# Patient Record
Sex: Male | Born: 1985 | Race: Black or African American | Hispanic: No | Marital: Single | Smoking: Former smoker
Health system: Southern US, Community
[De-identification: ages and names within clinical notes are randomized; demographics above are authoritative.]

---

## 2019-09-10 ENCOUNTER — Emergency Department (HOSPITAL_COMMUNITY): Payer: Self-pay

## 2019-09-10 ENCOUNTER — Inpatient Hospital Stay (HOSPITAL_COMMUNITY)
Admission: EM | Admit: 2019-09-10 | Discharge: 2019-09-13 | DRG: 958 | Payer: Self-pay | Attending: General Surgery | Admitting: General Surgery

## 2019-09-10 ENCOUNTER — Encounter (HOSPITAL_COMMUNITY): Payer: Self-pay | Admitting: Emergency Medicine

## 2019-09-10 ENCOUNTER — Other Ambulatory Visit: Payer: Self-pay

## 2019-09-10 DIAGNOSIS — J939 Pneumothorax, unspecified: Secondary | ICD-10-CM

## 2019-09-10 DIAGNOSIS — S45001A Unspecified injury of axillary artery, right side, initial encounter: Secondary | ICD-10-CM

## 2019-09-10 DIAGNOSIS — L089 Local infection of the skin and subcutaneous tissue, unspecified: Secondary | ICD-10-CM | POA: Diagnosis not present

## 2019-09-10 DIAGNOSIS — W3400XA Accidental discharge from unspecified firearms or gun, initial encounter: Secondary | ICD-10-CM

## 2019-09-10 DIAGNOSIS — S21341A Puncture wound with foreign body of right front wall of thorax with penetration into thoracic cavity, initial encounter: Secondary | ICD-10-CM | POA: Diagnosis present

## 2019-09-10 DIAGNOSIS — S51841A Puncture wound with foreign body of right forearm, initial encounter: Secondary | ICD-10-CM | POA: Diagnosis present

## 2019-09-10 DIAGNOSIS — Y249XXA Unspecified firearm discharge, undetermined intent, initial encounter: Secondary | ICD-10-CM

## 2019-09-10 DIAGNOSIS — S270XXA Traumatic pneumothorax, initial encounter: Secondary | ICD-10-CM

## 2019-09-10 DIAGNOSIS — S45091A Other specified injury of axillary artery, right side, initial encounter: Principal | ICD-10-CM | POA: Diagnosis present

## 2019-09-10 DIAGNOSIS — S62316B Displaced fracture of base of fifth metacarpal bone, right hand, initial encounter for open fracture: Secondary | ICD-10-CM

## 2019-09-10 DIAGNOSIS — S2241XA Multiple fractures of ribs, right side, initial encounter for closed fracture: Secondary | ICD-10-CM | POA: Diagnosis present

## 2019-09-10 DIAGNOSIS — Z23 Encounter for immunization: Secondary | ICD-10-CM

## 2019-09-10 DIAGNOSIS — J9690 Respiratory failure, unspecified, unspecified whether with hypoxia or hypercapnia: Secondary | ICD-10-CM

## 2019-09-10 DIAGNOSIS — S41131A Puncture wound without foreign body of right upper arm, initial encounter: Secondary | ICD-10-CM | POA: Diagnosis present

## 2019-09-10 DIAGNOSIS — Z20828 Contact with and (suspected) exposure to other viral communicable diseases: Secondary | ICD-10-CM | POA: Diagnosis present

## 2019-09-10 DIAGNOSIS — Y92410 Unspecified street and highway as the place of occurrence of the external cause: Secondary | ICD-10-CM

## 2019-09-10 DIAGNOSIS — S272XXA Traumatic hemopneumothorax, initial encounter: Secondary | ICD-10-CM | POA: Diagnosis present

## 2019-09-10 DIAGNOSIS — F17210 Nicotine dependence, cigarettes, uncomplicated: Secondary | ICD-10-CM | POA: Diagnosis present

## 2019-09-10 DIAGNOSIS — J942 Hemothorax: Secondary | ICD-10-CM

## 2019-09-10 LAB — CBC WITH DIFFERENTIAL/PLATELET
Abs Immature Granulocytes: 0.03 10*3/uL (ref 0.00–0.07)
Basophils Absolute: 0 10*3/uL (ref 0.0–0.1)
Basophils Relative: 0 %
Eosinophils Absolute: 0.2 10*3/uL (ref 0.0–0.5)
Eosinophils Relative: 2 %
HCT: 41.2 % (ref 39.0–52.0)
Hemoglobin: 13.4 g/dL (ref 13.0–17.0)
Immature Granulocytes: 0 %
Lymphocytes Relative: 26 %
Lymphs Abs: 3 10*3/uL (ref 0.7–4.0)
MCH: 29.2 pg (ref 26.0–34.0)
MCHC: 32.5 g/dL (ref 30.0–36.0)
MCV: 89.8 fL (ref 80.0–100.0)
Monocytes Absolute: 0.6 10*3/uL (ref 0.1–1.0)
Monocytes Relative: 5 %
Neutro Abs: 7.5 10*3/uL (ref 1.7–7.7)
Neutrophils Relative %: 67 %
Platelets: 383 10*3/uL (ref 150–400)
RBC: 4.59 MIL/uL (ref 4.22–5.81)
RDW: 13.8 % (ref 11.5–15.5)
WBC: 11.3 10*3/uL — ABNORMAL HIGH (ref 4.0–10.5)
nRBC: 0 % (ref 0.0–0.2)

## 2019-09-10 LAB — I-STAT CHEM 8, ED
BUN: 11 mg/dL (ref 6–20)
Calcium, Ion: 1.04 mmol/L — ABNORMAL LOW (ref 1.15–1.40)
Chloride: 102 mmol/L (ref 98–111)
Creatinine, Ser: 1.2 mg/dL (ref 0.61–1.24)
Glucose, Bld: 222 mg/dL — ABNORMAL HIGH (ref 70–99)
HCT: 45 % (ref 39.0–52.0)
Hemoglobin: 15.3 g/dL (ref 13.0–17.0)
Potassium: 3.4 mmol/L — ABNORMAL LOW (ref 3.5–5.1)
Sodium: 138 mmol/L (ref 135–145)
TCO2: 21 mmol/L — ABNORMAL LOW (ref 22–32)

## 2019-09-10 LAB — POC SARS CORONAVIRUS 2 AG -  ED: SARS Coronavirus 2 Ag: NEGATIVE

## 2019-09-10 LAB — SAMPLE TO BLOOD BANK

## 2019-09-10 MED ORDER — ONDANSETRON HCL 4 MG/2ML IJ SOLN
4.0000 mg | Freq: Once | INTRAMUSCULAR | Status: AC
  Administered 2019-09-10: 4 mg via INTRAVENOUS
  Filled 2019-09-10: qty 2

## 2019-09-10 MED ORDER — SODIUM CHLORIDE 0.9 % IV BOLUS
1000.0000 mL | Freq: Once | INTRAVENOUS | Status: AC
  Administered 2019-09-10: 1000 mL via INTRAVENOUS

## 2019-09-10 MED ORDER — TETANUS-DIPHTH-ACELL PERTUSSIS 5-2.5-18.5 LF-MCG/0.5 IM SUSP
0.5000 mL | Freq: Once | INTRAMUSCULAR | Status: AC
  Administered 2019-09-10: 0.5 mL via INTRAMUSCULAR
  Filled 2019-09-10: qty 0.5

## 2019-09-10 MED ORDER — HYDROMORPHONE HCL 1 MG/ML IJ SOLN
1.0000 mg | Freq: Once | INTRAMUSCULAR | Status: AC
  Administered 2019-09-10: 1 mg via INTRAVENOUS
  Filled 2019-09-10: qty 1

## 2019-09-10 MED ORDER — IOHEXOL 350 MG/ML SOLN
100.0000 mL | Freq: Once | INTRAVENOUS | Status: AC | PRN
  Administered 2019-09-10: 100 mL via INTRAVENOUS

## 2019-09-10 MED ORDER — SODIUM CHLORIDE 0.9 % IV BOLUS
500.0000 mL | Freq: Once | INTRAVENOUS | Status: AC
  Administered 2019-09-10: 500 mL via INTRAVENOUS

## 2019-09-10 MED ORDER — CEFAZOLIN SODIUM-DEXTROSE 2-4 GM/100ML-% IV SOLN
2.0000 g | Freq: Once | INTRAVENOUS | Status: AC
  Administered 2019-09-10: 2 g via INTRAVENOUS
  Filled 2019-09-10: qty 100
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
## 2019-09-10 NOTE — ED Notes (Signed)
Ortho tech called for splint placement. 
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
## 2019-09-10 NOTE — ED Notes (Signed)
Pt states he has pain with breathing. PA at bedside aware.
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
## 2019-09-11 ENCOUNTER — Inpatient Hospital Stay (HOSPITAL_COMMUNITY): Payer: Self-pay | Admitting: Anesthesiology

## 2019-09-11 ENCOUNTER — Encounter (HOSPITAL_COMMUNITY): Admission: EM | Payer: Self-pay | Source: Home / Self Care

## 2019-09-11 ENCOUNTER — Inpatient Hospital Stay (HOSPITAL_COMMUNITY): Payer: Self-pay | Admitting: Certified Registered"

## 2019-09-11 ENCOUNTER — Inpatient Hospital Stay (HOSPITAL_COMMUNITY): Payer: Self-pay

## 2019-09-11 ENCOUNTER — Encounter (HOSPITAL_COMMUNITY): Payer: Self-pay | Admitting: Anesthesiology

## 2019-09-11 DIAGNOSIS — W3400XA Accidental discharge from unspecified firearms or gun, initial encounter: Secondary | ICD-10-CM

## 2019-09-11 DIAGNOSIS — S45091A Other specified injury of axillary artery, right side, initial encounter: Secondary | ICD-10-CM

## 2019-09-11 HISTORY — PX: PERCUTANEOUS PINNING: SHX2209

## 2019-09-11 HISTORY — PX: I & D EXTREMITY: SHX5045

## 2019-09-11 HISTORY — PX: INTRAOPERATIVE ARTERIOGRAM: SHX5157

## 2019-09-11 HISTORY — PX: EMBOLECTOMY: SHX44

## 2019-09-11 HISTORY — PX: INSERTION OF ILIAC STENT: SHX6256

## 2019-09-11 HISTORY — PX: FOREIGN BODY REMOVAL: SHX962

## 2019-09-11 LAB — BASIC METABOLIC PANEL
Anion gap: 14 (ref 5–15)
Anion gap: 7 (ref 5–15)
BUN: 10 mg/dL (ref 6–20)
BUN: 6 mg/dL (ref 6–20)
CO2: 22 mmol/L (ref 22–32)
CO2: 26 mmol/L (ref 22–32)
Calcium: 9.1 mg/dL (ref 8.9–10.3)
Calcium: 8.1 mg/dL — ABNORMAL LOW (ref 8.9–10.3)
Chloride: 102 mmol/L (ref 98–111)
Chloride: 106 mmol/L (ref 98–111)
Creatinine, Ser: 1.38 mg/dL — ABNORMAL HIGH (ref 0.61–1.24)
Creatinine, Ser: 1.02 mg/dL (ref 0.61–1.24)
GFR calc Af Amer: >60 mL/min (ref >60)
GFR calc Af Amer: >60 mL/min (ref >60)
GFR calc non Af Amer: >60 mL/min (ref >60)
GFR calc non Af Amer: >60 mL/min (ref >60)
Glucose, Bld: 231 mg/dL — ABNORMAL HIGH (ref 70–99)
Glucose, Bld: 122 mg/dL — ABNORMAL HIGH (ref 70–99)
Potassium: 3.5 mmol/L (ref 3.5–5.1)
Potassium: 4 mmol/L (ref 3.5–5.1)
Sodium: 138 mmol/L (ref 135–145)
Sodium: 139 mmol/L (ref 135–145)

## 2019-09-11 LAB — CBC
HCT: 28.8 % — ABNORMAL LOW (ref 39.0–52.0)
Hemoglobin: 9.6 g/dL — ABNORMAL LOW (ref 13.0–17.0)
MCH: 29.7 pg (ref 26.0–34.0)
MCHC: 33.3 g/dL (ref 30.0–36.0)
MCV: 89.2 fL (ref 80.0–100.0)
Platelets: 274 10*3/uL (ref 150–400)
RBC: 3.23 MIL/uL — ABNORMAL LOW (ref 4.22–5.81)
RDW: 13.9 % (ref 11.5–15.5)
WBC: 12.5 10*3/uL — ABNORMAL HIGH (ref 4.0–10.5)
nRBC: 0 % (ref 0.0–0.2)

## 2019-09-11 LAB — RAPID URINE DRUG SCREEN, HOSP PERFORMED
Amphetamines: NOT DETECTED
Barbiturates: NOT DETECTED
Benzodiazepines: POSITIVE — AB
Cocaine: NOT DETECTED
Opiates: NOT DETECTED
Tetrahydrocannabinol: NOT DETECTED

## 2019-09-11 LAB — ETHANOL: Alcohol, Ethyl (B): <10 mg/dL (ref <10)

## 2019-09-11 LAB — HIV ANTIBODY (ROUTINE TESTING W REFLEX): HIV Screen 4th Generation wRfx: NONREACTIVE

## 2019-09-11 LAB — LACTIC ACID, PLASMA: Lactic Acid, Venous: 5.3 mmol/L (ref 0.5–1.9)

## 2019-09-11 LAB — MRSA PCR SCREENING: MRSA by PCR: NEGATIVE

## 2019-09-11 SURGERY — IRRIGATION AND DEBRIDEMENT EXTREMITY
Anesthesia: General | Site: Hand | Laterality: Right

## 2019-09-11 SURGERY — EMBOLECTOMY
Anesthesia: General | Laterality: Right

## 2019-09-11 MED ORDER — PHENYLEPHRINE 40 MCG/ML (10ML) SYRINGE FOR IV PUSH (FOR BLOOD PRESSURE SUPPORT)
PREFILLED_SYRINGE | INTRAVENOUS | Status: AC
  Filled 2019-09-11: qty 10

## 2019-09-11 MED ORDER — PHENYLEPHRINE 40 MCG/ML (10ML) SYRINGE FOR IV PUSH (FOR BLOOD PRESSURE SUPPORT)
PREFILLED_SYRINGE | INTRAVENOUS | Status: DC | PRN
  Administered 2019-09-11 (×12): 80 ug via INTRAVENOUS

## 2019-09-11 MED ORDER — ONDANSETRON HCL 4 MG/2ML IJ SOLN
INTRAMUSCULAR | Status: DC | PRN
  Administered 2019-09-11: 4 mg via INTRAVENOUS

## 2019-09-11 MED ORDER — 0.9 % SODIUM CHLORIDE (POUR BTL) OPTIME
TOPICAL | Status: DC | PRN
  Administered 2019-09-11: 1000 mL

## 2019-09-11 MED ORDER — ACETAMINOPHEN 500 MG PO TABS: 1000.0000 mg | ORAL_TABLET | Freq: Once | ORAL | Status: DC | PRN

## 2019-09-11 MED ORDER — FENTANYL CITRATE (PF) 250 MCG/5ML IJ SOLN
INTRAMUSCULAR | Status: DC | PRN
  Administered 2019-09-11: 100 ug via INTRAVENOUS
  Administered 2019-09-11 (×3): 50 ug via INTRAVENOUS

## 2019-09-11 MED ORDER — GABAPENTIN 100 MG PO CAPS
200.0000 mg | ORAL_CAPSULE | Freq: Three times a day (TID) | ORAL | Status: DC
  Administered 2019-09-11 – 2019-09-13 (×6): 200 mg via ORAL
  Filled 2019-09-11 (×6): qty 2

## 2019-09-11 MED ORDER — PROPOFOL 10 MG/ML IV BOLUS
INTRAVENOUS | Status: AC
  Filled 2019-09-11: qty 20

## 2019-09-11 MED ORDER — MIDAZOLAM HCL 5 MG/5ML IJ SOLN
INTRAMUSCULAR | Status: DC | PRN
  Administered 2019-09-11: 2 mg via INTRAVENOUS

## 2019-09-11 MED ORDER — HEPARIN SODIUM (PORCINE) 1000 UNIT/ML IJ SOLN
INTRAMUSCULAR | Status: DC | PRN
  Administered 2019-09-11: 6000 [IU] via INTRAVENOUS

## 2019-09-11 MED ORDER — ACETAMINOPHEN 10 MG/ML IV SOLN
INTRAVENOUS | Status: AC
  Filled 2019-09-11: qty 100

## 2019-09-11 MED ORDER — CHLORHEXIDINE GLUCONATE 4 % EX LIQD
60.0000 mL | Freq: Once | CUTANEOUS | Status: AC
  Administered 2019-09-11: 4 via TOPICAL
  Filled 2019-09-11: qty 60

## 2019-09-11 MED ORDER — BUPIVACAINE HCL (PF) 0.25 % IJ SOLN
INTRAMUSCULAR | Status: AC
  Filled 2019-09-11: qty 30

## 2019-09-11 MED ORDER — SUCCINYLCHOLINE CHLORIDE 200 MG/10ML IV SOSY
PREFILLED_SYRINGE | INTRAVENOUS | Status: AC
  Filled 2019-09-11: qty 10

## 2019-09-11 MED ORDER — FENTANYL CITRATE (PF) 250 MCG/5ML IJ SOLN
INTRAMUSCULAR | Status: AC
  Filled 2019-09-11: qty 5

## 2019-09-11 MED ORDER — FENTANYL CITRATE (PF) 100 MCG/2ML IJ SOLN
25.0000 ug | INTRAMUSCULAR | Status: DC | PRN
  Administered 2019-09-11 (×2): 50 ug via INTRAVENOUS

## 2019-09-11 MED ORDER — CEFAZOLIN SODIUM-DEXTROSE 2-3 GM-%(50ML) IV SOLR
INTRAVENOUS | Status: DC | PRN
  Administered 2019-09-11: 2 g via INTRAVENOUS

## 2019-09-11 MED ORDER — HYDROMORPHONE HCL 1 MG/ML IJ SOLN
1.0000 mg | INTRAMUSCULAR | Status: DC | PRN
  Administered 2019-09-11: 1 mg via INTRAVENOUS
  Filled 2019-09-11: qty 1

## 2019-09-11 MED ORDER — ONDANSETRON HCL 4 MG/2ML IJ SOLN
INTRAMUSCULAR | Status: AC
  Filled 2019-09-11: qty 2

## 2019-09-11 MED ORDER — HYDROMORPHONE HCL 1 MG/ML IJ SOLN
INTRAMUSCULAR | Status: AC
  Filled 2019-09-11: qty 1

## 2019-09-11 MED ORDER — OXYCODONE HCL 5 MG PO TABS: 5.0000 mg | ORAL_TABLET | Freq: Once | ORAL | Status: DC | PRN

## 2019-09-11 MED ORDER — PROTAMINE SULFATE 10 MG/ML IV SOLN
INTRAVENOUS | Status: AC
  Filled 2019-09-11: qty 5

## 2019-09-11 MED ORDER — DOCUSATE SODIUM 100 MG PO CAPS
100.0000 mg | ORAL_CAPSULE | Freq: Two times a day (BID) | ORAL | Status: DC
  Administered 2019-09-11 – 2019-09-13 (×4): 100 mg via ORAL
  Filled 2019-09-11 (×4): qty 1

## 2019-09-11 MED ORDER — FENTANYL CITRATE (PF) 250 MCG/5ML IJ SOLN
INTRAMUSCULAR | Status: DC | PRN
  Administered 2019-09-11: 50 ug via INTRAVENOUS

## 2019-09-11 MED ORDER — MIDAZOLAM HCL 5 MG/5ML IJ SOLN
INTRAMUSCULAR | Status: DC | PRN
  Administered 2019-09-11 (×2): 1 mg via INTRAVENOUS

## 2019-09-11 MED ORDER — ENOXAPARIN SODIUM 40 MG/0.4ML ~~LOC~~ SOLN
40.0000 mg | Freq: Every day | SUBCUTANEOUS | Status: DC
  Administered 2019-09-12 – 2019-09-13 (×2): 40 mg via SUBCUTANEOUS
  Filled 2019-09-11 (×3): qty 0.4

## 2019-09-11 MED ORDER — CEFAZOLIN SODIUM-DEXTROSE 2-4 GM/100ML-% IV SOLN
2.0000 g | Freq: Four times a day (QID) | INTRAVENOUS | Status: AC
  Administered 2019-09-11 – 2019-09-12 (×3): 2 g via INTRAVENOUS
  Filled 2019-09-11 (×5): qty 100

## 2019-09-11 MED ORDER — PROTAMINE SULFATE 10 MG/ML IV SOLN
INTRAVENOUS | Status: DC | PRN
  Administered 2019-09-11: 25 mg via INTRAVENOUS

## 2019-09-11 MED ORDER — MIDAZOLAM HCL 2 MG/2ML IJ SOLN
INTRAMUSCULAR | Status: AC
  Filled 2019-09-11: qty 2

## 2019-09-11 MED ORDER — ALBUMIN HUMAN 5 % IV SOLN
INTRAVENOUS | Status: DC | PRN
  Administered 2019-09-11 (×2): via INTRAVENOUS

## 2019-09-11 MED ORDER — ASPIRIN EC 81 MG PO TBEC
81.0000 mg | DELAYED_RELEASE_TABLET | Freq: Every day | ORAL | Status: DC
  Administered 2019-09-12 – 2019-09-13 (×2): 81 mg via ORAL
  Filled 2019-09-11 (×4): qty 1

## 2019-09-11 MED ORDER — ARTIFICIAL TEARS OPHTHALMIC OINT
TOPICAL_OINTMENT | OPHTHALMIC | Status: AC
  Filled 2019-09-11: qty 3.5

## 2019-09-11 MED ORDER — PROPOFOL 10 MG/ML IV BOLUS
INTRAVENOUS | Status: DC | PRN
  Administered 2019-09-11: 140 mg via INTRAVENOUS

## 2019-09-11 MED ORDER — CEFAZOLIN SODIUM 1 G IJ SOLR
INTRAMUSCULAR | Status: AC
  Filled 2019-09-11: qty 20

## 2019-09-11 MED ORDER — VANCOMYCIN HCL 1000 MG IV SOLR
INTRAVENOUS | Status: AC
  Filled 2019-09-11: qty 1000

## 2019-09-11 MED ORDER — OXYCODONE HCL 5 MG PO TABS
5.0000 mg | ORAL_TABLET | ORAL | Status: DC | PRN
  Administered 2019-09-11 (×3): 10 mg via ORAL
  Administered 2019-09-11: 5 mg via ORAL
  Administered 2019-09-12 – 2019-09-13 (×7): 10 mg via ORAL
  Filled 2019-09-11: qty 1
  Filled 2019-09-11 (×10): qty 2

## 2019-09-11 MED ORDER — ACETAMINOPHEN 160 MG/5ML PO SOLN: 1000.0000 mg | Freq: Once | ORAL | Status: DC | PRN

## 2019-09-11 MED ORDER — ACETAMINOPHEN 500 MG PO TABS
1000.0000 mg | ORAL_TABLET | Freq: Four times a day (QID) | ORAL | Status: DC
  Administered 2019-09-11 – 2019-09-13 (×8): 1000 mg via ORAL
  Filled 2019-09-11 (×8): qty 2

## 2019-09-11 MED ORDER — DEXAMETHASONE SODIUM PHOSPHATE 10 MG/ML IJ SOLN
INTRAMUSCULAR | Status: AC
  Filled 2019-09-11: qty 1

## 2019-09-11 MED ORDER — EPHEDRINE 5 MG/ML INJ
INTRAVENOUS | Status: AC
  Filled 2019-09-11: qty 10

## 2019-09-11 MED ORDER — ALBUMIN HUMAN 5 % IV SOLN
INTRAVENOUS | Status: DC | PRN
  Administered 2019-09-11: 16:00:00 via INTRAVENOUS

## 2019-09-11 MED ORDER — ONDANSETRON HCL 4 MG/2ML IJ SOLN: 4.0000 mg | Freq: Four times a day (QID) | INTRAMUSCULAR | Status: DC | PRN

## 2019-09-11 MED ORDER — SODIUM CHLORIDE 0.9 % IV SOLN
INTRAVENOUS | Status: DC | PRN
  Administered 2019-09-11: 500 mL

## 2019-09-11 MED ORDER — SODIUM CHLORIDE 0.9 % IV SOLN
INTRAVENOUS | Status: AC
  Filled 2019-09-11: qty 1.2

## 2019-09-11 MED ORDER — HEPARIN SODIUM (PORCINE) 1000 UNIT/ML IJ SOLN
INTRAMUSCULAR | Status: AC
  Filled 2019-09-11: qty 1

## 2019-09-11 MED ORDER — LIDOCAINE 2% (20 MG/ML) 5 ML SYRINGE
INTRAMUSCULAR | Status: AC
  Filled 2019-09-11: qty 5

## 2019-09-11 MED ORDER — HYDROMORPHONE HCL 1 MG/ML IJ SOLN
0.2500 mg | INTRAMUSCULAR | Status: DC | PRN
  Administered 2019-09-11 (×2): 0.5 mg via INTRAVENOUS

## 2019-09-11 MED ORDER — HYDROMORPHONE HCL 1 MG/ML IJ SOLN
0.2500 mg | INTRAMUSCULAR | Status: DC | PRN
  Administered 2019-09-11: 0.5 mg via INTRAVENOUS

## 2019-09-11 MED ORDER — HYDROMORPHONE HCL 1 MG/ML IJ SOLN
1.0000 mg | INTRAMUSCULAR | Status: DC | PRN
  Administered 2019-09-11 – 2019-09-12 (×2): 1 mg via INTRAVENOUS
  Filled 2019-09-11 (×2): qty 1

## 2019-09-11 MED ORDER — DEXTROSE-NACL 5-0.9 % IV SOLN
INTRAVENOUS | Status: DC
  Administered 2019-09-11 – 2019-09-12 (×3): via INTRAVENOUS

## 2019-09-11 MED ORDER — ROCURONIUM BROMIDE 10 MG/ML (PF) SYRINGE
PREFILLED_SYRINGE | INTRAVENOUS | Status: AC
  Filled 2019-09-11: qty 10

## 2019-09-11 MED ORDER — LIDOCAINE 2% (20 MG/ML) 5 ML SYRINGE
INTRAMUSCULAR | Status: DC | PRN
  Administered 2019-09-11: 60 mg via INTRAVENOUS

## 2019-09-11 MED ORDER — OXYCODONE HCL 5 MG/5ML PO SOLN: 5.0000 mg | Freq: Once | ORAL | Status: DC | PRN

## 2019-09-11 MED ORDER — ONDANSETRON 4 MG PO TBDP: 4.0000 mg | ORAL_TABLET | Freq: Four times a day (QID) | ORAL | Status: DC | PRN

## 2019-09-11 MED ORDER — SUCCINYLCHOLINE CHLORIDE 200 MG/10ML IV SOSY
PREFILLED_SYRINGE | INTRAVENOUS | Status: DC | PRN
  Administered 2019-09-11: 100 mg via INTRAVENOUS

## 2019-09-11 MED ORDER — CEFAZOLIN SODIUM-DEXTROSE 2-4 GM/100ML-% IV SOLN
2.0000 g | INTRAVENOUS | Status: AC
  Administered 2019-09-11: 2 g via INTRAVENOUS
  Filled 2019-09-11 (×2): qty 100

## 2019-09-11 MED ORDER — ACETAMINOPHEN 10 MG/ML IV SOLN
1000.0000 mg | Freq: Once | INTRAVENOUS | Status: DC | PRN
  Administered 2019-09-11: 1000 mg via INTRAVENOUS

## 2019-09-11 MED ORDER — ROCURONIUM BROMIDE 100 MG/10ML IV SOLN
INTRAVENOUS | Status: DC | PRN
  Administered 2019-09-11: 50 mg via INTRAVENOUS

## 2019-09-11 MED ORDER — FENTANYL CITRATE (PF) 100 MCG/2ML IJ SOLN
INTRAMUSCULAR | Status: AC
  Filled 2019-09-11: qty 2

## 2019-09-11 MED ORDER — DEXAMETHASONE SODIUM PHOSPHATE 10 MG/ML IJ SOLN
INTRAMUSCULAR | Status: DC | PRN
  Administered 2019-09-11: 10 mg via INTRAVENOUS

## 2019-09-11 MED ORDER — LIDOCAINE HCL (CARDIAC) PF 100 MG/5ML IV SOSY
PREFILLED_SYRINGE | INTRAVENOUS | Status: DC | PRN
  Administered 2019-09-11: 50 mg via INTRATRACHEAL

## 2019-09-11 MED ORDER — SODIUM CHLORIDE (PF) 0.9 % IJ SOLN
INTRAVENOUS | Status: DC | PRN
  Administered 2019-09-11: 100 mL via INTRAMUSCULAR

## 2019-09-11 MED ORDER — SODIUM CHLORIDE (PF) 0.9 % IJ SOLN
INTRAMUSCULAR | Status: AC
  Filled 2019-09-11: qty 10

## 2019-09-11 MED ORDER — SUGAMMADEX SODIUM 200 MG/2ML IV SOLN
INTRAVENOUS | Status: DC | PRN
  Administered 2019-09-11: 200 mg via INTRAVENOUS

## 2019-09-11 MED ORDER — LACTATED RINGERS IV SOLN
INTRAVENOUS | Status: DC
  Administered 2019-09-11: 15:00:00 via INTRAVENOUS

## 2019-09-11 MED ORDER — POVIDONE-IODINE 10 % EX SWAB: 2.0000 "application " | Freq: Once | CUTANEOUS | Status: DC

## 2019-09-11 MED ORDER — LACTATED RINGERS IV SOLN
INTRAVENOUS | Status: DC | PRN
  Administered 2019-09-11 (×2): via INTRAVENOUS

## 2019-09-11 MED ORDER — PROPOFOL 10 MG/ML IV BOLUS
INTRAVENOUS | Status: DC | PRN
  Administered 2019-09-11: 150 mg via INTRAVENOUS

## 2019-09-11 MED ORDER — BUPIVACAINE HCL 0.25 % IJ SOLN
INTRAMUSCULAR | Status: DC | PRN
  Administered 2019-09-11: 10 mL

## 2019-09-11 SURGICAL SUPPLY — 70 items
BALLN MUSTANG 5.0X40 75 (BALLOONS) ×2
BALLN MUSTANG 5.0X40 75CM (BALLOONS) ×1
BALLOON MUSTANG 5.0X40 75 (BALLOONS) IMPLANT
BNDG ELASTIC 4X5.8 VLCR STR LF (GAUZE/BANDAGES/DRESSINGS) ×2 IMPLANT
BNDG GAUZE ELAST 4 BULKY (GAUZE/BANDAGES/DRESSINGS) ×2 IMPLANT
CANISTER SUCT 3000ML PPV (MISCELLANEOUS) ×3 IMPLANT
CATH BEACON 5 .035 40 KMP TP (CATHETERS) IMPLANT
CATH BEACON 5 .038 40 KMP TP (CATHETERS) ×2
CATH EMB 4FR 80CM (CATHETERS) IMPLANT
CATH EMB 5FR 80CM (CATHETERS) IMPLANT
CATH POLY DUAL LUMEN OCCL 5FR (CATHETERS) ×2 IMPLANT
CLIP VESOCCLUDE MED 24/CT (CLIP) ×1 IMPLANT
CLIP VESOCCLUDE SM WIDE 24/CT (CLIP) ×1 IMPLANT
CLIP VESOCCLUDE SM WIDE 6/CT (CLIP) ×2 IMPLANT
COVER WAND RF STERILE (DRAPES) ×3 IMPLANT
DERMABOND ADVANCED (GAUZE/BANDAGES/DRESSINGS) ×2
DERMABOND ADVANCED .7 DNX12 (GAUZE/BANDAGES/DRESSINGS) ×1 IMPLANT
DRAIN CHANNEL 15F RND FF W/TCR (WOUND CARE) IMPLANT
DRAPE ORTHO SPLIT 77X108 STRL (DRAPES) ×4
DRAPE SURG ORHT 6 SPLT 77X108 (DRAPES) IMPLANT
DRAPE X-RAY CASS 24X20 (DRAPES) IMPLANT
ELECT REM PT RETURN 9FT ADLT (ELECTROSURGICAL) ×3
ELECTRODE REM PT RTRN 9FT ADLT (ELECTROSURGICAL) ×1 IMPLANT
EVACUATOR SILICONE 100CC (DRAIN) IMPLANT
GAUZE SPONGE 4X4 12PLY STRL LF (GAUZE/BANDAGES/DRESSINGS) ×2 IMPLANT
GLIDEWIRE ADV .035X260CM (WIRE) ×2 IMPLANT
GLOVE BIO SURGEON STRL SZ7.5 (GLOVE) ×3 IMPLANT
GLOVE BIOGEL PI IND STRL 6 (GLOVE) IMPLANT
GLOVE BIOGEL PI INDICATOR 6 (GLOVE) ×2
GLOVE SURG SS PI 6.5 STRL IVOR (GLOVE) ×6 IMPLANT
GOWN STRL REUS W/ TWL LRG LVL3 (GOWN DISPOSABLE) ×2 IMPLANT
GOWN STRL REUS W/ TWL XL LVL3 (GOWN DISPOSABLE) ×1 IMPLANT
GOWN STRL REUS W/TWL LRG LVL3 (GOWN DISPOSABLE) ×6
GOWN STRL REUS W/TWL XL LVL3 (GOWN DISPOSABLE) ×2
HEMOSTAT SNOW SURGICEL 2X4 (HEMOSTASIS) IMPLANT
KIT BASIN OR (CUSTOM PROCEDURE TRAY) ×3 IMPLANT
KIT ENCORE 26 ADVANTAGE (KITS) ×2 IMPLANT
KIT TURNOVER KIT B (KITS) ×3 IMPLANT
NS IRRIG 1000ML POUR BTL (IV SOLUTION) ×6 IMPLANT
PACK PERIPHERAL VASCULAR (CUSTOM PROCEDURE TRAY) ×3 IMPLANT
PAD ABD 8X10 STRL (GAUZE/BANDAGES/DRESSINGS) ×2 IMPLANT
PAD ARMBOARD 7.5X6 YLW CONV (MISCELLANEOUS) ×6 IMPLANT
SET COLLECT BLD 21X3/4 12 (NEEDLE) IMPLANT
SET MICROPUNCTURE 5F STIFF (MISCELLANEOUS) ×2 IMPLANT
SHEATH PINNACLE 6F 10CM (SHEATH) ×2 IMPLANT
SHEATH PINNACLE 8F 10CM (SHEATH) ×2 IMPLANT
SPONGE LAP 18X18 X RAY DECT (DISPOSABLE) ×2 IMPLANT
STENT VIABAHN 6X50X120 (Permanent Stent) ×2 IMPLANT
STOPCOCK 4 WAY LG BORE MALE ST (IV SETS) IMPLANT
SUT ETHILON 3 0 PS 1 (SUTURE) IMPLANT
SUT MNCRL AB 4-0 PS2 18 (SUTURE) ×1 IMPLANT
SUT PROLENE 5 0 C 1 24 (SUTURE) IMPLANT
SUT PROLENE 6 0 BV (SUTURE) ×5 IMPLANT
SUT SILK 3 0 (SUTURE)
SUT SILK 3-0 18XBRD TIE 12 (SUTURE) IMPLANT
SUT VIC AB 2-0 CT1 27 (SUTURE)
SUT VIC AB 2-0 CT1 TAPERPNT 27 (SUTURE) ×1 IMPLANT
SUT VIC AB 3-0 SH 27 (SUTURE) ×2
SUT VIC AB 3-0 SH 27X BRD (SUTURE) ×1 IMPLANT
SUT VIC AB 4-0 PS2 18 (SUTURE) ×2 IMPLANT
SYR 10ML LL (SYRINGE) ×4 IMPLANT
SYR 3ML LL SCALE MARK (SYRINGE) IMPLANT
SYR BULB IRRIGATION 50ML (SYRINGE) ×2 IMPLANT
TOWEL GREEN STERILE (TOWEL DISPOSABLE) ×3 IMPLANT
TRAY FOLEY MTR SLVR 16FR STAT (SET/KITS/TRAYS/PACK) ×3 IMPLANT
TUBING EXTENTION W/L.L. (IV SETS) IMPLANT
UNDERPAD 30X30 (UNDERPADS AND DIAPERS) ×3 IMPLANT
WATER STERILE IRR 1000ML POUR (IV SOLUTION) ×3 IMPLANT
WIRE BENTSON .035X145CM (WIRE) ×2 IMPLANT
WIRE G V18X300CM (WIRE) ×2 IMPLANT

## 2019-09-11 SURGICAL SUPPLY — 71 items
BANDAGE ESMARK 6X9 LF (GAUZE/BANDAGES/DRESSINGS) IMPLANT
BLADE SURG 10 STRL SS (BLADE) ×3 IMPLANT
BNDG COHESIVE 4X5 TAN STRL (GAUZE/BANDAGES/DRESSINGS) ×3 IMPLANT
BNDG CONFORM 2 STRL LF (GAUZE/BANDAGES/DRESSINGS) ×2 IMPLANT
BNDG ELASTIC 2X5.8 VLCR STR LF (GAUZE/BANDAGES/DRESSINGS) ×2 IMPLANT
BNDG ELASTIC 3X5.8 VLCR STR LF (GAUZE/BANDAGES/DRESSINGS) ×2 IMPLANT
BNDG ELASTIC 4X5.8 VLCR STR LF (GAUZE/BANDAGES/DRESSINGS) ×3 IMPLANT
BNDG ELASTIC 6X5.8 VLCR STR LF (GAUZE/BANDAGES/DRESSINGS) ×3 IMPLANT
BNDG ESMARK 4X9 LF (GAUZE/BANDAGES/DRESSINGS) IMPLANT
BNDG ESMARK 6X9 LF (GAUZE/BANDAGES/DRESSINGS)
BNDG GAUZE ELAST 4 BULKY (GAUZE/BANDAGES/DRESSINGS) ×3 IMPLANT
CONT SPEC 4OZ CLIKSEAL STRL BL (MISCELLANEOUS) IMPLANT
CORD BIPOLAR FORCEPS 12FT (ELECTRODE) ×2 IMPLANT
COVER SURGICAL LIGHT HANDLE (MISCELLANEOUS) ×3 IMPLANT
COVER WAND RF STERILE (DRAPES) ×3 IMPLANT
CUFF TOURN SGL LL 12 NO SLV (MISCELLANEOUS) IMPLANT
CUFF TOURN SGL QUICK 34 (TOURNIQUET CUFF)
CUFF TRNQT CYL 34X4.125X (TOURNIQUET CUFF) IMPLANT
DRAPE SURG 17X23 STRL (DRAPES) IMPLANT
DRAPE U-SHAPE 47X51 STRL (DRAPES) IMPLANT
DRSG ADAPTIC 3X8 NADH LF (GAUZE/BANDAGES/DRESSINGS) ×2 IMPLANT
DRSG PAD ABDOMINAL 8X10 ST (GAUZE/BANDAGES/DRESSINGS) ×3 IMPLANT
DRSG XEROFORM 1X8 (GAUZE/BANDAGES/DRESSINGS) ×2 IMPLANT
DURAPREP 26ML APPLICATOR (WOUND CARE) ×3 IMPLANT
ELECT REM PT RETURN 9FT ADLT (ELECTROSURGICAL)
ELECTRODE REM PT RTRN 9FT ADLT (ELECTROSURGICAL) IMPLANT
EVACUATOR 1/8 PVC DRAIN (DRAIN) IMPLANT
FACESHIELD WRAPAROUND (MASK) IMPLANT
FACESHIELD WRAPAROUND OR TEAM (MASK) ×1 IMPLANT
GAUZE SPONGE 4X4 12PLY STRL (GAUZE/BANDAGES/DRESSINGS) ×3 IMPLANT
GAUZE XEROFORM 1X8 LF (GAUZE/BANDAGES/DRESSINGS) ×3 IMPLANT
GLOVE BIO SURGEON STRL SZ7.5 (GLOVE) ×6 IMPLANT
GLOVE BIOGEL PI IND STRL 8 (GLOVE) ×2 IMPLANT
GLOVE BIOGEL PI INDICATOR 8 (GLOVE) ×4
GOWN STRL REUS W/ TWL LRG LVL3 (GOWN DISPOSABLE) ×3 IMPLANT
GOWN STRL REUS W/TWL LRG LVL3 (GOWN DISPOSABLE) ×6
HANDPIECE INTERPULSE COAX TIP (DISPOSABLE)
K-WIRE .045X4 (WIRE) ×2 IMPLANT
K-WIRE .062 (WIRE) ×2
K-WIRE FX6X.062X2 END TROC (WIRE) ×1
KIT BASIN OR (CUSTOM PROCEDURE TRAY) ×3 IMPLANT
KIT TURNOVER KIT B (KITS) ×3 IMPLANT
KWIRE FX6X.062X2 END TROC (WIRE) IMPLANT
MANIFOLD NEPTUNE II (INSTRUMENTS) ×3 IMPLANT
NDL HYPO 25GX1X1/2 BEV (NEEDLE) IMPLANT
NEEDLE HYPO 25GX1X1/2 BEV (NEEDLE) ×3 IMPLANT
NS IRRIG 1000ML POUR BTL (IV SOLUTION) ×3 IMPLANT
PACK ORTHO EXTREMITY (CUSTOM PROCEDURE TRAY) ×3 IMPLANT
PAD ABD 8X10 STRL (GAUZE/BANDAGES/DRESSINGS) ×2 IMPLANT
PAD ARMBOARD 7.5X6 YLW CONV (MISCELLANEOUS) ×6 IMPLANT
PAD CAST 3X4 CTTN HI CHSV (CAST SUPPLIES) IMPLANT
PADDING CAST COTTON 3X4 STRL (CAST SUPPLIES) ×2
PADDING UNDERCAST 2 STRL (CAST SUPPLIES) ×2
PADDING UNDERCAST 2X4 STRL (CAST SUPPLIES) IMPLANT
SET CYSTO W/LG BORE CLAMP LF (SET/KITS/TRAYS/PACK) IMPLANT
SET HNDPC FAN SPRY TIP SCT (DISPOSABLE) IMPLANT
SLING ARM FOAM STRAP LRG (SOFTGOODS) ×2 IMPLANT
SPLINT PLASTER EXTRA FAST 3X15 (CAST SUPPLIES) ×6
SPLINT PLASTER GYPS XFAST 3X15 (CAST SUPPLIES) IMPLANT
SPONGE LAP 18X18 RF (DISPOSABLE) IMPLANT
STOCKINETTE IMPERVIOUS 9X36 MD (GAUZE/BANDAGES/DRESSINGS) ×3 IMPLANT
SUT ETHILON 3 0 PS 1 (SUTURE) ×4 IMPLANT
SUT PDS AB 2-0 CT1 27 (SUTURE) IMPLANT
SWAB CULTURE ESWAB REG 1ML (MISCELLANEOUS) IMPLANT
SYR CONTROL 10ML LL (SYRINGE) IMPLANT
TOWEL GREEN STERILE (TOWEL DISPOSABLE) ×3 IMPLANT
TOWEL GREEN STERILE FF (TOWEL DISPOSABLE) ×3 IMPLANT
TUBE CONNECTING 12'X1/4 (SUCTIONS) ×2
TUBE CONNECTING 12X1/4 (SUCTIONS) ×3 IMPLANT
UNDERPAD 30X30 (UNDERPADS AND DIAPERS) ×3 IMPLANT
YANKAUER SUCT BULB TIP NO VENT (SUCTIONS) ×3 IMPLANT
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
## 2019-09-11 NOTE — Transfer of Care (Signed)
Immediate Anesthesia Transfer of Care Note  Patient: Nicholas Burnett  Procedure(s) Performed: THROMBECTOMY RIGHT AXILLARY ARTERY (Right ) INSERTION OF RIGHT AXILLARY ARTERY STENT 6MMX 5CM (Right ) Intra Operative Arteriogram RIGHT UPPER EXTREMITY (Right )  Patient Location: PACU  Anesthesia Type:General  Level of Consciousness: awake  Airway & Oxygen Therapy: Patient Spontanous Breathing and Patient connected to face mask oxygen  Post-op Assessment: Report given to RN and Post -op Vital signs reviewed and stable  Post vital signs: Reviewed and stable  Last Vitals:  Vitals Value Taken Time  BP    Temp    Pulse 127 09/11/19 0429  Resp 25 09/11/19 0429  SpO2 100 % 09/11/19 0429  Vitals shown include unvalidated device data.  Last Pain:  Vitals:   09/10/19 2226  TempSrc: Tympanic         Complications: No apparent anesthesia complications
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
## 2019-09-11 NOTE — Progress Notes (Signed)
Orthopedic Tech Progress Note Patient Details:  Saw Mendenhall 26-Apr-1986 [id number]  Patient ID: Nicholas Burnett, male   DOB: [birth year], 33 y.o.   MRN: [id number]   [name] 09/11/2019, Tresa Moore informed me in report that the patient is going to surgery, and will not need splint at this time.
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
## 2019-09-11 NOTE — Progress Notes (Signed)
Pt received from PACU, AO x4, slightly drowsy. Complaning of pain to right arm, had received pain meds before transfer. Vitals stable, CHG bath completed. Connected to monitor and CCMD notified. Right arm surgical site has ace wrap, dry and intact. Chest tube to right lateral chest intact, dranning bloody output. CT to wall suction -20. Oriented pt to room and call bell system. Two [employer] outside pt's room, not allowed any phone calls or contact with family/friends at this time per [employer].will continue to monitor.

## 2019-09-11 NOTE — Progress Notes (Signed)
PT Cancellation Note  Patient Details Name: Nicholas Burnett MRN: [id number] DOB: [birth year]   Cancelled Treatment:    Reason Eval/Treat Not Completed: Other (comment). Pt going for surgery on hand today. Had vascular surgery earlier this morning. Acute PT to return as able, as appropriate to complete PT eval.  Kittie Plater, PT, DPT Acute Rehabilitation Services Pager #: [phone] Office #: [phone]    Berline Lopes 09/11/2019, 12:29 PM

## 2019-09-11 NOTE — Interval H&P Note (Signed)
I participated in the care of this patient and agree with the above history, physical and evaluation. I performed a review of the history and a physical exam as detailed   Dezi Brauner Daniel Alek Borges MD  
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
## 2019-09-11 NOTE — Progress Notes (Addendum)
[facility]  Subjective  - No new complaints   Objective 137/84 92 99.7 F (37.6 C) (Oral) 17 100%  Intake/Output Summary (Last 24 hours) at 09/11/2019 0707 Last data filed at 09/11/2019 0510 Gross per 24 hour  Intake 3050 ml  Output 2940 ml  Net 110 ml    Right radial palpable pulse, fingers warm and motor intact Dressing clean and dry  Assessment/Planning: 1.  Exposure right brachial artery 2.  Right upper extremity thromboembolectomy 3.  Right upper extremity angiogram 4.  Stent of right axillary artery with 26mm x 5cm Viabahn  Started 81 mg Aspirin this am will add Plavix when OK with Trauma service. Elevation encouraged  [name] 09/11/2019 7:07 AM --  Laboratory Lab Results: Recent Labs    09/10/19 2237 09/10/19 2240  WBC  --  11.3*  HGB 15.3 13.4  HCT 45.0 41.2  PLT  --  383   BMET Recent Labs    09/10/19 2237 09/10/19 2240  NA 138 138  K 3.4* 3.5  CL 102 102  CO2  --  22  GLUCOSE 222* 231*  BUN 11 10  CREATININE 1.20 1.38*  CALCIUM  --  9.1    COAG No results found for: INR, PROTIME No results found for: PTT   I have independently interviewed and examined patient and agree with PA assessment and plan above.  Aspirin for now.  Ideally would start Plavix prior to discharge.  Nicholas Burnett C. Donzetta Matters, MD [facility] Office: [phone] Pager: [phone]

## 2019-09-11 NOTE — Anesthesia Procedure Notes (Signed)
Procedure Name: LMA Insertion Date/Time: 09/11/2019 3:03 PM Performed by: Janene Harvey, CRNA Pre-anesthesia Checklist: Patient identified, Emergency Drugs available, Suction available and Patient being monitored Patient Re-evaluated:Patient Re-evaluated prior to induction Oxygen Delivery Method: Circle system utilized Preoxygenation: Pre-oxygenation with 100% oxygen Induction Type: IV induction LMA: LMA inserted LMA Size: 4.0 Dental Injury: Teeth and Oropharynx as per pre-operative assessment

## 2019-09-11 NOTE — Anesthesia Procedure Notes (Signed)
Procedure Name: Intubation Date/Time: 09/11/2019 2:48 AM Performed by: Clovis Cao, CRNA Pre-anesthesia Checklist: Patient identified, Emergency Drugs available, Patient being monitored, Suction available and Timeout performed Patient Re-evaluated:Patient Re-evaluated prior to induction Oxygen Delivery Method: Circle system utilized Preoxygenation: Pre-oxygenation with 100% oxygen Induction Type: Rapid sequence, IV induction and Cricoid Pressure applied Laryngoscope Size: Miller and 2 Grade View: Grade I Tube type: Oral Tube size: 8.0 mm Number of attempts: 1 Airway Equipment and Method: Stylet Placement Confirmation: ETT inserted through vocal cords under direct vision,  positive ETCO2 and breath sounds checked- equal and bilateral Secured at: 22 cm Tube secured with: Tape Dental Injury: Teeth and Oropharynx as per pre-operative assessment

## 2019-09-11 NOTE — Transfer of Care (Signed)
Immediate Anesthesia Transfer of Care Note  Patient: Claudie Leach  Procedure(s) Performed: IRRIGATION AND DEBRIDEMENT HAND (Right Hand) Percutaneous Pinning Extremity (Right Hand) Foreign Body Removal Adult (Right Hand)  Patient Location: PACU  Anesthesia Type:General  Level of Consciousness: drowsy  Airway & Oxygen Therapy: Patient Spontanous Breathing and Patient connected to face mask oxygen  Post-op Assessment: Report given to RN and Post -op Vital signs reviewed and stable  Post vital signs: Reviewed  Last Vitals:  Vitals Value Taken Time  BP 116/55 09/11/19 1605  Temp    Pulse 75 09/11/19 1609  Resp 17 09/11/19 1609  SpO2 96 % 09/11/19 1609  Vitals shown include unvalidated device data.  Last Pain:  Vitals:   09/11/19 1241  TempSrc: Oral  PainSc: 10-Worst pain ever      Patients Stated Pain Goal: 0 (13/[birth year] 7846)  Complications: No apparent anesthesia complications
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
## 2019-09-11 NOTE — Procedures (Signed)
Pt had R pigtail catheter placed via seldinger technique + air rush PT tol well Chest CT showed chest tube within chest.

## 2019-09-11 NOTE — ED Notes (Signed)
Dr. Cain at bedside.
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
## 2019-09-11 NOTE — Anesthesia Preprocedure Evaluation (Addendum)
Anesthesia Evaluation  Patient identified by MRN, date of birth, ID band Patient awake    Reviewed: Allergy & Precautions, H&P , NPO status , Patient's Chart, lab work & pertinent test results  Airway Mallampati: II  TM Distance: >3 FB Neck ROM: Full    Dental no notable dental hx. (+) Teeth Intact, Dental Advisory Given   Pulmonary Current Smoker and Patient abstained from smoking.,    Pulmonary exam normal breath sounds clear to auscultation       Cardiovascular negative cardio ROS   Rhythm:Regular Rate:Normal     Neuro/Psych negative neurological ROS  negative psych ROS   GI/Hepatic negative GI ROS, Neg liver ROS,   Endo/Other  negative endocrine ROS  Renal/GU negative Renal ROS  negative genitourinary   Musculoskeletal   Abdominal   Peds  Hematology negative hematology ROS (+)   Anesthesia Other Findings   Reproductive/Obstetrics negative OB ROS                            Anesthesia Physical Anesthesia Plan  ASA: I  Anesthesia Plan: General   Post-op Pain Management:    Induction: Intravenous  PONV Risk Score and Plan: 3 and Ondansetron, Dexamethasone and Midazolam  Airway Management Planned: LMA  Additional Equipment:   Intra-op Plan:   Post-operative Plan: Extubation in OR  Informed Consent: I have reviewed the patients History and Physical, chart, labs and discussed the procedure including the risks, benefits and alternatives for the proposed anesthesia with the patient or authorized representative who has indicated his/her understanding and acceptance.     Dental advisory given  Plan Discussed with: CRNA  Anesthesia Plan Comments:         Anesthesia Quick Evaluation

## 2019-09-12 ENCOUNTER — Encounter (HOSPITAL_COMMUNITY): Payer: Self-pay | Admitting: Vascular Surgery

## 2019-09-12 ENCOUNTER — Inpatient Hospital Stay (HOSPITAL_COMMUNITY): Payer: Self-pay

## 2019-09-12 MED ORDER — METHOCARBAMOL 500 MG PO TABS
1000.0000 mg | ORAL_TABLET | Freq: Three times a day (TID) | ORAL | Status: DC
  Administered 2019-09-12 – 2019-09-13 (×3): 1000 mg via ORAL
  Filled 2019-09-12 (×3): qty 2

## 2019-09-12 MED ORDER — MAGNESIUM CITRATE PO SOLN: 1.0000 | Freq: Once | ORAL | Status: DC

## 2019-09-12 MED ORDER — BISACODYL 10 MG RE SUPP: 10.0000 mg | Freq: Once | RECTAL | Status: DC

## 2019-09-12 MED ORDER — HYDROMORPHONE HCL 1 MG/ML IJ SOLN
0.5000 mg | Freq: Three times a day (TID) | INTRAMUSCULAR | Status: DC
  Administered 2019-09-12 – 2019-09-13 (×3): 0.5 mg via INTRAVENOUS
  Filled 2019-09-12 (×3): qty 1

## 2019-09-12 MED ORDER — KETOROLAC TROMETHAMINE 15 MG/ML IJ SOLN
15.0000 mg | Freq: Four times a day (QID) | INTRAMUSCULAR | Status: DC | PRN
  Administered 2019-09-12 – 2019-09-13 (×4): 15 mg via INTRAVENOUS
  Filled 2019-09-12 (×4): qty 1
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
## 2019-09-12 NOTE — Anesthesia Postprocedure Evaluation (Signed)
Anesthesia Post Note  Patient: Nicholas Burnett  Procedure(s) Performed: IRRIGATION AND DEBRIDEMENT HAND (Right Hand) Percutaneous Pinning Extremity (Right Hand) Foreign Body Removal Adult (Right Hand)     Patient location during evaluation: PACU Anesthesia Type: General Level of consciousness: sedated and patient cooperative Pain management: pain level controlled Vital Signs Assessment: post-procedure vital signs reviewed and stable Respiratory status: spontaneous breathing Cardiovascular status: stable Anesthetic complications: no    Last Vitals:  Vitals:   09/11/19 2158 09/12/19 0320  BP:  112/65  Pulse:  63  Resp:  13  Temp: 36.9 C 37.1 C  SpO2:  100%    Last Pain:  Vitals:   09/12/19 0548  TempSrc:   PainSc: [place]
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
## 2019-09-13 ENCOUNTER — Inpatient Hospital Stay (HOSPITAL_COMMUNITY): Payer: Self-pay

## 2019-09-13 LAB — CBC
HCT: 24.5 % — ABNORMAL LOW (ref 39.0–52.0)
HCT: 24.1 % — ABNORMAL LOW (ref 39.0–52.0)
Hemoglobin: 8.2 g/dL — ABNORMAL LOW (ref 13.0–17.0)
Hemoglobin: 7.9 g/dL — ABNORMAL LOW (ref 13.0–17.0)
MCH: 30.1 pg (ref 26.0–34.0)
MCH: 29.8 pg (ref 26.0–34.0)
MCHC: 33.5 g/dL (ref 30.0–36.0)
MCHC: 32.8 g/dL (ref 30.0–36.0)
MCV: 90.1 fL (ref 80.0–100.0)
MCV: 90.9 fL (ref 80.0–100.0)
Platelets: 268 10*3/uL (ref 150–400)
Platelets: 265 10*3/uL (ref 150–400)
RBC: 2.72 MIL/uL — ABNORMAL LOW (ref 4.22–5.81)
RBC: 2.65 MIL/uL — ABNORMAL LOW (ref 4.22–5.81)
RDW: 13.7 % (ref 11.5–15.5)
RDW: 14 % (ref 11.5–15.5)
WBC: 13.4 10*3/uL — ABNORMAL HIGH (ref 4.0–10.5)
WBC: 10.9 10*3/uL — ABNORMAL HIGH (ref 4.0–10.5)
nRBC: 0 % (ref 0.0–0.2)
nRBC: 0 % (ref 0.0–0.2)

## 2019-09-13 LAB — BASIC METABOLIC PANEL
Anion gap: 10 (ref 5–15)
BUN: 6 mg/dL (ref 6–20)
CO2: 28 mmol/L (ref 22–32)
Calcium: 8.9 mg/dL (ref 8.9–10.3)
Chloride: 102 mmol/L (ref 98–111)
Creatinine, Ser: 1.04 mg/dL (ref 0.61–1.24)
GFR calc Af Amer: >60 mL/min (ref >60)
GFR calc non Af Amer: >60 mL/min (ref >60)
Glucose, Bld: 103 mg/dL — ABNORMAL HIGH (ref 70–99)
Potassium: 3.4 mmol/L — ABNORMAL LOW (ref 3.5–5.1)
Sodium: 140 mmol/L (ref 135–145)

## 2019-09-13 LAB — PHOSPHORUS: Phosphorus: 2.8 mg/dL (ref 2.5–4.6)

## 2019-09-13 LAB — MAGNESIUM: Magnesium: 1.7 mg/dL (ref 1.7–2.4)

## 2019-09-13 MED ORDER — ASPIRIN 81 MG PO TBEC: 81.0000 mg | DELAYED_RELEASE_TABLET | Freq: Every day | ORAL | Status: DC

## 2019-09-13 MED ORDER — GABAPENTIN 100 MG PO CAPS: 200.0000 mg | ORAL_CAPSULE | Freq: Three times a day (TID) | ORAL | 0 refills | Status: DC

## 2019-09-13 MED ORDER — METHOCARBAMOL 750 MG PO TABS: 750.0000 mg | ORAL_TABLET | Freq: Three times a day (TID) | ORAL | Status: DC | PRN

## 2019-09-13 MED ORDER — CLOPIDOGREL BISULFATE 75 MG PO TABS: 75.0000 mg | ORAL_TABLET | Freq: Every day | ORAL | 0 refills | Status: AC

## 2019-09-13 MED ORDER — METHOCARBAMOL 750 MG PO TABS: 750.0000 mg | ORAL_TABLET | Freq: Three times a day (TID) | ORAL | 0 refills | Status: DC | PRN

## 2019-09-13 MED ORDER — OXYCODONE HCL 5 MG PO TABS: 5.0000 mg | ORAL_TABLET | ORAL | 0 refills | Status: DC | PRN

## 2019-09-13 MED ORDER — ACETAMINOPHEN 500 MG PO TABS: 1000.0000 mg | ORAL_TABLET | Freq: Four times a day (QID) | ORAL | 0 refills | Status: DC

## 2019-09-13 NOTE — Progress Notes (Signed)
Pt given discharge instructions. Packet given to [employer] to give to jail medical staff. Patient discharged to [employer] custody. Katherina Right RN

## 2019-09-13 NOTE — Progress Notes (Signed)
Removed patient chest tube. Tube intact. Patient tolerated well. No new drainage noted in collection canister. Will continue to monitor. Katherina Right RN
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
## 2019-09-13 NOTE — Discharge Instructions (Signed)
Pneumothorax °A pneumothorax is commonly called a collapsed lung. It is a condition in which air leaks from a lung and builds up between the thin layer of tissue that covers the lungs (visceral pleura) and the interior wall of the chest cavity (parietal pleura). The air gets trapped outside the lung, between the lung and the chest wall (pleural space). The air takes up space and prevents the lung from fully expanding. °This condition sometimes occurs suddenly with no apparent cause. The buildup of air may be small or large. A small pneumothorax may go away on its own. A large pneumothorax will require treatment and hospitalization. °What are the causes? °This condition may be caused by: °· Trauma and injury to the chest wall. °· Surgery and other medical procedures. °· A complication of an underlying lung problem, especially chronic obstructive pulmonary disease (COPD) or emphysema. °Sometimes the cause of this condition is not known. °What increases the risk? °You are more likely to develop this condition if: °· You have an underlying lung problem. °· You smoke. °· You are 20-40 years old, male, tall, and underweight. °· You have a personal or family history of pneumothorax. °· You have an eating disorder (anorexia nervosa). °This condition can also happen quickly, even in people with no history of lung problems. °What are the signs or symptoms? °Sometimes a pneumothorax will have no symptoms. When symptoms are present, they can include: °· Chest pain. °· Shortness of breath. °· Increased rate of breathing. °· Bluish color to your lips or skin (cyanosis). °How is this diagnosed? °This condition may be diagnosed by: °· A medical history and physical exam. °· A chest X-ray, chest CT scan, or ultrasound. °How is this treated? °Treatment depends on how severe your condition is. The goal of treatment is to remove the extra air and allow your lung to expand back to its normal size. °· For a small pneumothorax: °? No  treatment may be needed. °? Extra oxygen is sometimes used to make it go away more quickly. °· For a large pneumothorax or a pneumothorax that is causing symptoms, a procedure is done to drain the air from your lungs. To do this, a health care provider may use: °? A needle with a syringe. This is used to suck air from a pleural space where no additional leakage is taking place. °? A chest tube. This is used to suck air where there is ongoing leakage into the pleural space. The chest tube may need to remain in place for several days until the air leak has healed. °· In more severe cases, surgery may be needed to repair the damage that is causing the leak. °· If you have multiple pneumothorax episodes or have an air leak that will not heal, a procedure called a pleurodesis may be done. A medicine is placed in the pleural space to irritate the tissues around the lung so that the lung will stick to the chest wall, seal any leaks, and stop any buildup of air in that space. °If you have an underlying lung problem, severe symptoms, or a large pneumothorax you will usually need to stay in the hospital. °Follow these instructions at home: °Lifestyle °· Do not use any products that contain nicotine or tobacco, such as cigarettes and e-cigarettes. These are major risk factors in pneumothorax. If you need help quitting, ask your health care provider. °· Do not lift anything that is heavier than 10 lb (4.5 kg), or the limit that your health care   provider tells you, until he or she says that it is safe.  Avoid activities that take a lot of effort (strenuous) for as long as told by your health care provider.  Return to your normal activities as told by your health care provider. Ask your health care provider what activities are safe for you.  Do not fly in an airplane or scuba dive until your health care provider says it is okay. General instructions  Take over-the-counter and prescription medicines only as told by your  health care provider.  If a cough or pain makes it difficult for you to sleep at night, try sleeping in a semi-upright position in a recliner or by using 2 or 3 pillows.  If you had a chest tube and it was removed, ask your health care provider when you can remove the bandage (dressing). While the dressing is in place, do not allow it to get wet.  Keep all follow-up visits as told by your health care provider. This is important. Contact a health care provider if:  You cough up thick mucus (sputum) that is yellow or green in color.  You were treated with a chest tube, and you have redness, increasing pain, or discharge at the site where it was placed. Get help right away if:  You have increasing chest pain or shortness of breath.  You have a cough that will not go away.  You begin coughing up blood.  You have pain that is getting worse or is not controlled with medicines.  The site where your chest tube was located opens up.  You feel air coming out of the site where the chest tube was placed.  You have a fever or persistent symptoms for more than 2-3 days.  You have a fever and your symptoms suddenly get worse. These symptoms may represent a serious problem that is an emergency. Do not wait to see if the symptoms will go away. Get medical help right away. Call your local emergency services (911 in the [place] Do not drive yourself to the hospital. Summary  A pneumothorax, commonly called a collapsed lung, is a condition in which air leaks from a lung and gets trapped between the lung and the chest wall (pleural space).  The buildup of air may be small or large. A small pneumothorax may go away on its own. A large pneumothorax will require treatment and hospitalization.  Treatment for this condition depends on how severe the pneumothorax is. The goal of treatment is to remove the extra air and allow the lung to expand back to its normal size. This information is not intended to  replace advice given to you by your health care provider. Make sure you discuss any questions you have with your health care provider. Document Released: 10/05/2005 Document Revised: 09/17/2017 Document Reviewed: 09/13/2017 Elsevier Patient Education  2020 [employer].   Metacarpal Fracture  A metacarpal fracture is a break (fracture) of a bone in the hand. Metacarpals are the bones that go from your knuckles to your wrist. You have five metacarpal bones in each hand. This fracture is usually caused by a fall or an injury that crushes the hand. This injury is diagnosed with medical history, a physical exam, or imaging tests, such as an X-ray. What are the signs or symptoms? Symptoms of this condition may include:  Pain.  Swelling.  Stiffness.  Bruising.  Inability to move a finger.  A finger that looks misshapen.  An abnormal bend or bump  in the hand or finger (deformity). How is this treated? Treatment depends on how bad the injury is.  If your broken bone is still in place and did not move, you may need: ? To wear a splint or cast for several weeks. ? To have the broken finger taped to another finger next to it (buddy taping).  If the broken bone has pieces that moved and no longer line up, your doctor may: ? Do surgery to fix the bones into place with metal screws, plates, or wires. ? Move the bones back into position without surgery (closed reduction).  After your bones are put together, you will need to wear a splint or cast for several weeks. Treatment may also include:  Physical therapy after your cast or splint is removed.  Follow-up visits and X-rays to make sure you are healing. Follow these instructions at home: If you have a splint:  Wear the splint as told by your doctor. Remove it only as told by your doctor.  Loosen the splint if your fingers or toes tingle, become numb, or turn cold and blue.  Keep the splint clean.  If the splint is not  waterproof: ? Do not let it get wet. ? Cover it with a watertight covering when you take a bath or a shower. If you have a cast:  Do not stick anything inside the cast to scratch your skin.  Check the skin around the cast every day. Tell your doctor about any concerns.  You may put lotion on dry skin around the edges of the cast. Do not put lotion on the skin underneath the cast.  Keep the cast clean.  If the cast is not waterproof: ? Do not let it get wet. ? Cover it with a watertight covering when you take a bath or a shower. Activity  Do not lift or hold anything with your injured hand.  Return to your normal activities as told by your doctor. Ask your doctor what activities are safe for you.  Do exercises as told by your doctor. Driving  Do not drive or use heavy machinery while taking pain medicine.  Do not drive while wearing a cast or splint on a hand that you use for driving. Managing pain, stiffness, and swelling  If told, put ice on the injured area. Put ice only if you have a splint, not a cast. ? If you can remove your splint, remove it as told by your doctor. ? Put ice in a plastic bag. ? Place a towel between your skin and the bag. ? Leave the ice on for 20 minutes, 2-3 times a day.  Move your fingers often. This helps to prevent stiffness and swelling.  Raise the injured area above the level of your heart while you are sitting or lying down. General instructions  Do not put pressure on any part of the cast or splint until it is fully hardened. This may take several hours.  Do not use any products that contain nicotine or tobacco, such as cigarettes and e-cigarettes. These can delay bone healing. If you need help quitting, ask your doctor.  Do not take baths, swim, or use a hot tub until your doctor says it is okay. Ask your doctor if you may take showers. You may only be allowed to take sponge baths.  Take over-the-counter and prescription medicines only  as told by your doctor.  Keep all follow-up visits as told by your doctor. This is important. Contact  a doctor if:  Your pain is worse.  You have redness, swelling, or pain that gets worse.  You have a fever.  There is a bad smell coming from your cast or splint. Get help right away if:  You have very bad pain under the cast or in your hand.  You have trouble breathing.  The following happen, even after you loosen your splint: ? Your hand or fingernails turn blue or gray. ? Your hand feels cold or numb. Summary  A metacarpal fracture is a break (fracture) of a bone in the hand.  Treatment depends on how bad the injury is. You may need a cast or splint for a broken bone that did not move. You may need surgery for a very bad injury that moved the pieces of bone in your hand.  Follow your doctor's instructions for taking care of your injury after treatment. This information is not intended to replace advice given to you by your health care provider. Make sure you discuss any questions you have with your health care provider. Document Released: 03/23/2008 Document Revised: 11/12/2017 Document Reviewed: 11/12/2017 Elsevier Patient Education  2020 [employer].

## 2019-09-21 NOTE — Anesthesia Postprocedure Evaluation (Signed)
Anesthesia Post Note  Patient: Nicholas Burnett  Procedure(s) Performed: THROMBECTOMY RIGHT AXILLARY ARTERY (Right ) INSERTION OF RIGHT AXILLARY ARTERY STENT 6MMX 5CM (Right ) Intra Operative Arteriogram RIGHT UPPER EXTREMITY (Right )     Patient location during evaluation: PACU Anesthesia Type: General Level of consciousness: awake and patient cooperative Pain management: pain level controlled Vital Signs Assessment: post-procedure vital signs reviewed and stable Respiratory status: spontaneous breathing, nonlabored ventilation, respiratory function stable and patient connected to nasal cannula oxygen Cardiovascular status: blood pressure returned to baseline and stable Postop Assessment: no apparent nausea or vomiting Anesthetic complications: no    Last Vitals:  Vitals:   09/13/19 0803 09/13/19 1139  BP: 109/69 110/70  Pulse: 75 62  Resp: 19 15  Temp: 37 C 36.9 C  SpO2: 99% 95%    Last Pain:  Vitals:   09/13/19 1139  TempSrc: Oral  PainSc:                  [name]

## 2019-12-01 ENCOUNTER — Other Ambulatory Visit: Payer: Self-pay

## 2019-12-01 ENCOUNTER — Other Ambulatory Visit: Payer: Self-pay | Admitting: *Deleted

## 2019-12-01 ENCOUNTER — Ambulatory Visit (INDEPENDENT_AMBULATORY_CARE_PROVIDER_SITE_OTHER): Payer: Self-pay | Admitting: Physician Assistant

## 2019-12-01 ENCOUNTER — Ambulatory Visit (HOSPITAL_COMMUNITY)
Admission: RE | Admit: 2019-12-01 | Discharge: 2019-12-01 | Disposition: A | Discharge status: Home / Self Care | Source: Ambulatory Visit | Attending: Vascular Surgery | Admitting: Vascular Surgery

## 2019-12-01 VITALS — BP 96/66 | HR 67 | Temp 97.3°F | Resp 14 | Ht 69.0 in | Wt 114.0 lb

## 2019-12-01 DIAGNOSIS — W3400XA Accidental discharge from unspecified firearms or gun, initial encounter: Secondary | ICD-10-CM

## 2019-12-01 DIAGNOSIS — M79601 Pain in right arm: Secondary | ICD-10-CM | POA: Insufficient documentation

## 2019-12-01 DIAGNOSIS — T82856A Stenosis of peripheral vascular stent, initial encounter: Secondary | ICD-10-CM | POA: Insufficient documentation

## 2019-12-01 DIAGNOSIS — T82856D Stenosis of peripheral vascular stent, subsequent encounter: Secondary | ICD-10-CM
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
## 2019-12-08 ENCOUNTER — Telehealth: Payer: Self-pay

## 2019-12-08 NOTE — Telephone Encounter (Signed)
Fax sent to [phone] Attn: Angela Nevin with pt's pre procedure letter/instructions.

## 2019-12-08 NOTE — Telephone Encounter (Signed)
Spoke with Angela Nevin at pt's facility to schedule his procedure for 3/8 with an 0700 arrival time. The facility will do his covid screening within 4 days of procedure and bring his results with him on the morning of his procedure. They are aware he is to be NPO after midnight and to hold Plavix for 2 days. No further questions/concerns at this time.

## 2019-12-12 ENCOUNTER — Other Ambulatory Visit: Payer: Self-pay

## 2019-12-25 ENCOUNTER — Encounter (HOSPITAL_COMMUNITY): Admission: RE | Payer: Self-pay | Source: Home / Self Care | Attending: Vascular Surgery

## 2019-12-25 ENCOUNTER — Ambulatory Visit (HOSPITAL_COMMUNITY): Admission: RE | Admit: 2019-12-25 | Discharge: 2019-12-25 | Attending: Vascular Surgery | Admitting: Vascular Surgery

## 2019-12-25 ENCOUNTER — Other Ambulatory Visit: Payer: Self-pay

## 2019-12-25 DIAGNOSIS — F17210 Nicotine dependence, cigarettes, uncomplicated: Secondary | ICD-10-CM | POA: Diagnosis not present

## 2019-12-25 DIAGNOSIS — T82856A Stenosis of peripheral vascular stent, initial encounter: Secondary | ICD-10-CM | POA: Insufficient documentation

## 2019-12-25 DIAGNOSIS — Y832 Surgical operation with anastomosis, bypass or graft as the cause of abnormal reaction of the patient, or of later complication, without mention of misadventure at the time of the procedure: Secondary | ICD-10-CM | POA: Insufficient documentation

## 2019-12-25 DIAGNOSIS — Z7982 Long term (current) use of aspirin: Secondary | ICD-10-CM | POA: Diagnosis not present

## 2019-12-25 HISTORY — PX: PERIPHERAL VASCULAR BALLOON ANGIOPLASTY: CATH118281

## 2019-12-25 HISTORY — PX: AORTIC ARCH ANGIOGRAPHY: CATH118224

## 2019-12-25 LAB — POCT I-STAT, CHEM 8
BUN: 10 mg/dL (ref 6–20)
Calcium, Ion: 1.16 mmol/L (ref 1.15–1.40)
Chloride: 104 mmol/L (ref 98–111)
Creatinine, Ser: 1 mg/dL (ref 0.61–1.24)
Glucose, Bld: 87 mg/dL (ref 70–99)
HCT: 38 % — ABNORMAL LOW (ref 39.0–52.0)
Hemoglobin: 12.9 g/dL — ABNORMAL LOW (ref 13.0–17.0)
Potassium: 4.3 mmol/L (ref 3.5–5.1)
Sodium: 140 mmol/L (ref 135–145)
TCO2: 34 mmol/L — ABNORMAL HIGH (ref 22–32)

## 2019-12-25 SURGERY — AORTIC ARCH ANGIOGRAPHY
Anesthesia: LOCAL | Laterality: Right

## 2019-12-25 MED ORDER — LIDOCAINE HCL (PF) 1 % IJ SOLN
INTRAMUSCULAR | Status: AC
  Filled 2019-12-25: qty 30

## 2019-12-25 MED ORDER — ONDANSETRON HCL 4 MG/2ML IJ SOLN: 4.0000 mg | Freq: Four times a day (QID) | INTRAMUSCULAR | Status: DC | PRN

## 2019-12-25 MED ORDER — FENTANYL CITRATE (PF) 100 MCG/2ML IJ SOLN
INTRAMUSCULAR | Status: DC | PRN
  Administered 2019-12-25: 50 ug via INTRAVENOUS

## 2019-12-25 MED ORDER — SODIUM CHLORIDE 0.9 % IV SOLN: INTRAVENOUS | Status: DC

## 2019-12-25 MED ORDER — SODIUM CHLORIDE 0.9 % WEIGHT BASED INFUSION: 1.0000 mL/kg/h | INTRAVENOUS | Status: DC

## 2019-12-25 MED ORDER — MIDAZOLAM HCL 2 MG/2ML IJ SOLN
INTRAMUSCULAR | Status: AC
  Filled 2019-12-25: qty 2

## 2019-12-25 MED ORDER — CLOPIDOGREL BISULFATE 300 MG PO TABS
ORAL_TABLET | ORAL | Status: DC | PRN
  Administered 2019-12-25: 300 mg via ORAL

## 2019-12-25 MED ORDER — IODIXANOL 320 MG/ML IV SOLN
INTRAVENOUS | Status: DC | PRN
  Administered 2019-12-25: 65 mL via INTRA_ARTERIAL

## 2019-12-25 MED ORDER — SODIUM CHLORIDE 0.9% FLUSH: 3.0000 mL | INTRAVENOUS | Status: DC | PRN

## 2019-12-25 MED ORDER — HEPARIN SODIUM (PORCINE) 1000 UNIT/ML IJ SOLN
INTRAMUSCULAR | Status: DC | PRN
  Administered 2019-12-25: 4000 [IU] via INTRAVENOUS
  Administered 2019-12-25: 2000 [IU] via INTRAVENOUS

## 2019-12-25 MED ORDER — ACETAMINOPHEN 325 MG PO TABS: 650.0000 mg | ORAL_TABLET | ORAL | Status: DC | PRN

## 2019-12-25 MED ORDER — HEPARIN SODIUM (PORCINE) 1000 UNIT/ML IJ SOLN
INTRAMUSCULAR | Status: AC
  Filled 2019-12-25: qty 1

## 2019-12-25 MED ORDER — FENTANYL CITRATE (PF) 100 MCG/2ML IJ SOLN
INTRAMUSCULAR | Status: AC
  Filled 2019-12-25: qty 2

## 2019-12-25 MED ORDER — HYDRALAZINE HCL 20 MG/ML IJ SOLN: 5.0000 mg | INTRAMUSCULAR | Status: DC | PRN

## 2019-12-25 MED ORDER — LIDOCAINE HCL (PF) 1 % IJ SOLN
INTRAMUSCULAR | Status: DC | PRN
  Administered 2019-12-25: 18 mL via INTRADERMAL

## 2019-12-25 MED ORDER — CLOPIDOGREL BISULFATE 75 MG PO TABS: 75.0000 mg | ORAL_TABLET | Freq: Every day | ORAL | Status: DC

## 2019-12-25 MED ORDER — HEPARIN (PORCINE) IN NACL 1000-0.9 UT/500ML-% IV SOLN
INTRAVENOUS | Status: DC | PRN
  Administered 2019-12-25 (×2): 500 mL

## 2019-12-25 MED ORDER — SODIUM CHLORIDE 0.9% FLUSH: 3.0000 mL | Freq: Two times a day (BID) | INTRAVENOUS | Status: DC

## 2019-12-25 MED ORDER — CLOPIDOGREL BISULFATE 75 MG PO TABS: 300.0000 mg | ORAL_TABLET | Freq: Once | ORAL | Status: DC

## 2019-12-25 MED ORDER — HEPARIN (PORCINE) IN NACL 1000-0.9 UT/500ML-% IV SOLN
INTRAVENOUS | Status: AC
  Filled 2019-12-25: qty 1000

## 2019-12-25 MED ORDER — MIDAZOLAM HCL 2 MG/2ML IJ SOLN
INTRAMUSCULAR | Status: DC | PRN
  Administered 2019-12-25: 1 mg via INTRAVENOUS

## 2019-12-25 MED ORDER — SODIUM CHLORIDE 0.9 % IV SOLN: 250.0000 mL | INTRAVENOUS | Status: DC | PRN

## 2019-12-25 MED ORDER — LABETALOL HCL 5 MG/ML IV SOLN: 10.0000 mg | INTRAVENOUS | Status: DC | PRN

## 2019-12-25 SURGICAL SUPPLY — 18 items
BALLN IN.PACT DCB 5X40 (BALLOONS) ×3
BALLN MUSTANG 5.0X40 135 (BALLOONS) ×3
BALLOON MUSTANG 5.0X40 135 (BALLOONS) ×2 IMPLANT
CATH ANGIO 5F BER2 100CM (CATHETERS) ×3 IMPLANT
CATH ANGIO 5F PIGTAIL 100CM (CATHETERS) ×3 IMPLANT
CLOSURE MYNX CONTROL 6F/7F (Vascular Products) ×3 IMPLANT
DCB IN.PACT 5X40 (BALLOONS) ×2 IMPLANT
KIT ENCORE 26 ADVANTAGE (KITS) ×3 IMPLANT
KIT MICROPUNCTURE NIT STIFF (SHEATH) ×3 IMPLANT
KIT PV (KITS) ×3 IMPLANT
SHEATH GUIDING CAROTID 6FRX90 (SHEATH) ×3 IMPLANT
SHEATH PINNACLE 5F 10CM (SHEATH) ×3 IMPLANT
SHEATH PINNACLE 6F 10CM (SHEATH) ×3 IMPLANT
SHEATH PROBE COVER 6X72 (BAG) ×3 IMPLANT
SYR MEDRAD MARK V 150ML (SYRINGE) ×3 IMPLANT
TRANSDUCER W/STOPCOCK (MISCELLANEOUS) ×3 IMPLANT
TRAY PV CATH (CUSTOM PROCEDURE TRAY) ×3 IMPLANT
WIRE HI TORQ VERSACORE J 260CM (WIRE) ×3 IMPLANT

## 2019-12-25 NOTE — Progress Notes (Signed)
Up and walked and tolerated well; right groin stable, no bleeding or hematoma 

## 2019-12-25 NOTE — H&P (Signed)
   History and Physical Update  The patient was interviewed and re-examined.  The patient's previous History and Physical has been reviewed and is unchanged from recent office visit. Plan for arch angio with right upper extremity angio and possible intervention.   Kavin Weckwerth C. Randie Heinz, MD [facility] Office: [phone] Pager: [phone]  12/25/2019, 9:39 AM

## 2019-12-25 NOTE — Progress Notes (Signed)
Sat pt up to ambulate  after 2 hour bed rest, right groin bled, pressure held 30 minutes. Level 0, vss, Dr Randie Heinz was paged/ updated, pt to lay flat 2 more hours.
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
## 2019-12-25 NOTE — Discharge Instructions (Signed)
Restart plavix 75 mg daily starting 3/9 Stop aspirin   Angiogram, Care After This sheet gives you information about how to care for yourself after your procedure. Your health care provider may also give you more specific instructions. If you have problems or questions, contact your health care provider. What can I expect after the procedure? After the procedure, it is common to have bruising and tenderness at the catheter insertion area. Follow these instructions at home: Insertion site care  Follow instructions from your health care provider about how to take care of your insertion site. Make sure you: ? Wash your hands with soap and water before you change your bandage (dressing). If soap and water are not available, use hand sanitizer. ? Change your dressing as told by your health care provider. ? Leave stitches (sutures), skin glue, or adhesive strips in place. These skin closures may need to stay in place for 2 weeks or longer. If adhesive strip edges start to loosen and curl up, you may trim the loose edges. Do not remove adhesive strips completely unless your health care provider tells you to do that.  Do not take baths, swim, or use a hot tub until your health care provider approves.  You may shower 24-48 hours after the procedure or as told by your health care provider. ? Gently wash the site with plain soap and water. ? Pat the area dry with a clean towel. ? Do not rub the site. This may cause bleeding.  Do not apply powder or lotion to the site. Keep the site clean and dry.  Check your insertion site every day for signs of infection. Check for: ? Redness, swelling, or pain. ? Fluid or blood. ? Warmth. ? Pus or a bad smell. Activity  Rest as told by your health care provider, usually for 1-2 days.  Do not lift anything that is heavier than 10 lbs. (4.5 kg) or as told by your health care provider.  Do not drive for 24 hours if you were given a medicine to help you relax  (sedative).  Do not drive or use heavy machinery while taking prescription pain medicine. General instructions   Return to your normal activities as told by your health care provider, usually in about a week. Ask your health care provider what activities are safe for you.  If the catheter site starts bleeding, lie flat and put pressure on the site. If the bleeding does not stop, get help right away. This is a medical emergency.  Drink enough fluid to keep your urine clear or pale yellow. This helps flush the contrast dye from your body.  Take over-the-counter and prescription medicines only as told by your health care provider.  Keep all follow-up visits as told by your health care provider. This is important. Contact a health care provider if:  You have a fever or chills.  You have redness, swelling, or pain around your insertion site.  You have fluid or blood coming from your insertion site.  The insertion site feels warm to the touch.  You have pus or a bad smell coming from your insertion site.  You have bruising around the insertion site.  You notice blood collecting in the tissue around the catheter site (hematoma). The hematoma may be painful to the touch. Get help right away if:  You have severe pain at the catheter insertion area.  The catheter insertion area swells very fast.  The catheter insertion area is bleeding, and the bleeding  does not stop when you hold steady pressure on the area.  The area near or just beyond the catheter insertion site becomes pale, cool, tingly, or numb. These symptoms may represent a serious problem that is an emergency. Do not wait to see if the symptoms will go away. Get medical help right away. Call your local emergency services (911 in the [place] Do not drive yourself to the hospital. Summary  After the procedure, it is common to have bruising and tenderness at the catheter insertion area.  After the procedure, it is important to  rest and drink plenty of fluids.  Do not take baths, swim, or use a hot tub until your health care provider says it is okay to do so. You may shower 24-48 hours after the procedure or as told by your health care provider.  If the catheter site starts bleeding, lie flat and put pressure on the site. If the bleeding does not stop, get help right away. This is a medical emergency. This information is not intended to replace advice given to you by your health care provider. Make sure you discuss any questions you have with your health care provider. Document Revised: 09/17/2017 Document Reviewed: 09/09/2016 Elsevier Patient Education  2020 [employer].

## 2020-01-26 ENCOUNTER — Other Ambulatory Visit: Payer: Self-pay | Admitting: *Deleted

## 2020-01-26 DIAGNOSIS — W3400XA Accidental discharge from unspecified firearms or gun, initial encounter: Secondary | ICD-10-CM

## 2020-02-02 ENCOUNTER — Other Ambulatory Visit: Payer: Self-pay

## 2020-02-02 ENCOUNTER — Ambulatory Visit (HOSPITAL_COMMUNITY)
Admission: RE | Admit: 2020-02-02 | Discharge: 2020-02-02 | Disposition: A | Discharge status: Home / Self Care | Source: Ambulatory Visit | Attending: Vascular Surgery | Admitting: Vascular Surgery

## 2020-02-02 ENCOUNTER — Ambulatory Visit (INDEPENDENT_AMBULATORY_CARE_PROVIDER_SITE_OTHER): Admitting: Physician Assistant

## 2020-02-02 VITALS — BP 104/67 | HR 54 | Temp 97.8°F | Resp 16 | Ht 65.0 in | Wt 114.6 lb

## 2020-02-02 DIAGNOSIS — I739 Peripheral vascular disease, unspecified: Secondary | ICD-10-CM

## 2020-02-02 DIAGNOSIS — S55101A Unspecified injury of radial artery at forearm level, right arm, initial encounter: Secondary | ICD-10-CM

## 2020-02-02 DIAGNOSIS — T148XXA Other injury of unspecified body region, initial encounter: Secondary | ICD-10-CM | POA: Diagnosis not present

## 2020-02-02 DIAGNOSIS — W3400XA Accidental discharge from unspecified firearms or gun, initial encounter: Secondary | ICD-10-CM | POA: Diagnosis not present

## 2020-02-02 DIAGNOSIS — Z95820 Peripheral vascular angioplasty status with implants and grafts: Secondary | ICD-10-CM | POA: Diagnosis not present

## 2020-02-02 DIAGNOSIS — T82856D Stenosis of peripheral vascular stent, subsequent encounter: Secondary | ICD-10-CM | POA: Diagnosis not present
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
## 2020-02-05 ENCOUNTER — Other Ambulatory Visit: Payer: Self-pay | Admitting: *Deleted

## 2020-02-05 DIAGNOSIS — T82856D Stenosis of peripheral vascular stent, subsequent encounter: Secondary | ICD-10-CM

## 2020-02-05 DIAGNOSIS — W3400XA Accidental discharge from unspecified firearms or gun, initial encounter: Secondary | ICD-10-CM

## 2021-01-30 ENCOUNTER — Other Ambulatory Visit: Payer: Self-pay | Admitting: Physician Assistant

## 2021-01-30 DIAGNOSIS — W3400XA Accidental discharge from unspecified firearms or gun, initial encounter: Secondary | ICD-10-CM

## 2021-01-30 DIAGNOSIS — T82856D Stenosis of peripheral vascular stent, subsequent encounter: Secondary | ICD-10-CM
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
